# Patient Record
Sex: Male | Born: 1995 | Race: White | Hispanic: No | Marital: Single | Smoking: Never smoker
Health system: Southern US, Community
[De-identification: ages and names within clinical notes are randomized; demographics above are authoritative.]

---
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
## 2017-02-15 ENCOUNTER — Other Ambulatory Visit: Payer: Self-pay | Admitting: Ophthalmology

## 2017-02-15 DIAGNOSIS — H469 Unspecified optic neuritis: Secondary | ICD-10-CM

## 2017-12-18 DIAGNOSIS — M546 Pain in thoracic spine: Secondary | ICD-10-CM | POA: Diagnosis not present

## 2017-12-18 DIAGNOSIS — M25319 Other instability, unspecified shoulder: Secondary | ICD-10-CM | POA: Diagnosis not present

## 2017-12-18 DIAGNOSIS — M9902 Segmental and somatic dysfunction of thoracic region: Secondary | ICD-10-CM | POA: Diagnosis not present

## 2017-12-18 DIAGNOSIS — S29019A Strain of muscle and tendon of unspecified wall of thorax, initial encounter: Secondary | ICD-10-CM | POA: Diagnosis not present

## 2018-03-09 DIAGNOSIS — J209 Acute bronchitis, unspecified: Secondary | ICD-10-CM | POA: Diagnosis not present

## 2018-08-27 DIAGNOSIS — Z03818 Encounter for observation for suspected exposure to other biological agents ruled out: Secondary | ICD-10-CM | POA: Diagnosis not present

## 2018-08-27 DIAGNOSIS — U071 COVID-19: Secondary | ICD-10-CM | POA: Diagnosis not present

## 2018-09-03 DIAGNOSIS — Z03818 Encounter for observation for suspected exposure to other biological agents ruled out: Secondary | ICD-10-CM | POA: Diagnosis not present

## 2019-01-22 DIAGNOSIS — J3489 Other specified disorders of nose and nasal sinuses: Secondary | ICD-10-CM | POA: Diagnosis not present

## 2019-01-22 DIAGNOSIS — Z20828 Contact with and (suspected) exposure to other viral communicable diseases: Secondary | ICD-10-CM | POA: Diagnosis not present

## 2019-03-06 DIAGNOSIS — M25319 Other instability, unspecified shoulder: Secondary | ICD-10-CM | POA: Diagnosis not present

## 2019-03-06 DIAGNOSIS — S29019A Strain of muscle and tendon of unspecified wall of thorax, initial encounter: Secondary | ICD-10-CM | POA: Diagnosis not present

## 2019-03-06 DIAGNOSIS — M9902 Segmental and somatic dysfunction of thoracic region: Secondary | ICD-10-CM | POA: Diagnosis not present

## 2019-03-06 DIAGNOSIS — M546 Pain in thoracic spine: Secondary | ICD-10-CM | POA: Diagnosis not present

## 2019-03-13 DIAGNOSIS — M25319 Other instability, unspecified shoulder: Secondary | ICD-10-CM | POA: Diagnosis not present

## 2019-03-13 DIAGNOSIS — M9902 Segmental and somatic dysfunction of thoracic region: Secondary | ICD-10-CM | POA: Diagnosis not present

## 2019-03-13 DIAGNOSIS — S29019A Strain of muscle and tendon of unspecified wall of thorax, initial encounter: Secondary | ICD-10-CM | POA: Diagnosis not present

## 2019-03-13 DIAGNOSIS — M546 Pain in thoracic spine: Secondary | ICD-10-CM | POA: Diagnosis not present

## 2019-03-20 DIAGNOSIS — M25319 Other instability, unspecified shoulder: Secondary | ICD-10-CM | POA: Diagnosis not present

## 2019-03-20 DIAGNOSIS — S29019A Strain of muscle and tendon of unspecified wall of thorax, initial encounter: Secondary | ICD-10-CM | POA: Diagnosis not present

## 2019-03-20 DIAGNOSIS — M9902 Segmental and somatic dysfunction of thoracic region: Secondary | ICD-10-CM | POA: Diagnosis not present

## 2019-03-20 DIAGNOSIS — M546 Pain in thoracic spine: Secondary | ICD-10-CM | POA: Diagnosis not present

## 2019-03-27 DIAGNOSIS — M9902 Segmental and somatic dysfunction of thoracic region: Secondary | ICD-10-CM | POA: Diagnosis not present

## 2019-03-27 DIAGNOSIS — S29019A Strain of muscle and tendon of unspecified wall of thorax, initial encounter: Secondary | ICD-10-CM | POA: Diagnosis not present

## 2019-03-27 DIAGNOSIS — M25319 Other instability, unspecified shoulder: Secondary | ICD-10-CM | POA: Diagnosis not present

## 2019-03-27 DIAGNOSIS — M546 Pain in thoracic spine: Secondary | ICD-10-CM | POA: Diagnosis not present

## 2019-04-03 DIAGNOSIS — S29019A Strain of muscle and tendon of unspecified wall of thorax, initial encounter: Secondary | ICD-10-CM | POA: Diagnosis not present

## 2019-04-03 DIAGNOSIS — M25319 Other instability, unspecified shoulder: Secondary | ICD-10-CM | POA: Diagnosis not present

## 2019-04-03 DIAGNOSIS — M9902 Segmental and somatic dysfunction of thoracic region: Secondary | ICD-10-CM | POA: Diagnosis not present

## 2019-04-03 DIAGNOSIS — M546 Pain in thoracic spine: Secondary | ICD-10-CM | POA: Diagnosis not present

## 2019-05-17 DIAGNOSIS — Z13220 Encounter for screening for lipoid disorders: Secondary | ICD-10-CM | POA: Diagnosis not present

## 2019-05-17 DIAGNOSIS — Z0000 Encounter for general adult medical examination without abnormal findings: Secondary | ICD-10-CM | POA: Diagnosis not present

## 2019-05-17 DIAGNOSIS — Z131 Encounter for screening for diabetes mellitus: Secondary | ICD-10-CM | POA: Diagnosis not present

## 2019-05-17 DIAGNOSIS — Z1331 Encounter for screening for depression: Secondary | ICD-10-CM | POA: Diagnosis not present

## 2019-05-24 DIAGNOSIS — Z13220 Encounter for screening for lipoid disorders: Secondary | ICD-10-CM | POA: Diagnosis not present

## 2019-05-24 DIAGNOSIS — Z131 Encounter for screening for diabetes mellitus: Secondary | ICD-10-CM | POA: Diagnosis not present

## 2019-05-24 DIAGNOSIS — Z0000 Encounter for general adult medical examination without abnormal findings: Secondary | ICD-10-CM | POA: Diagnosis not present

## 2019-10-01 DIAGNOSIS — M791 Myalgia, unspecified site: Secondary | ICD-10-CM | POA: Diagnosis not present

## 2019-10-01 DIAGNOSIS — Z20828 Contact with and (suspected) exposure to other viral communicable diseases: Secondary | ICD-10-CM | POA: Diagnosis not present

## 2019-10-01 DIAGNOSIS — R05 Cough: Secondary | ICD-10-CM | POA: Diagnosis not present

## 2019-10-01 DIAGNOSIS — R5383 Other fatigue: Secondary | ICD-10-CM | POA: Diagnosis not present

## 2019-10-06 DIAGNOSIS — R070 Pain in throat: Secondary | ICD-10-CM | POA: Diagnosis not present

## 2019-10-06 DIAGNOSIS — Z20828 Contact with and (suspected) exposure to other viral communicable diseases: Secondary | ICD-10-CM | POA: Diagnosis not present

## 2019-10-06 DIAGNOSIS — R05 Cough: Secondary | ICD-10-CM | POA: Diagnosis not present

## 2020-01-03 DIAGNOSIS — M25511 Pain in right shoulder: Secondary | ICD-10-CM | POA: Diagnosis not present

## 2020-09-22 DIAGNOSIS — S50862A Insect bite (nonvenomous) of left forearm, initial encounter: Secondary | ICD-10-CM | POA: Diagnosis not present

## 2020-09-22 DIAGNOSIS — L03114 Cellulitis of left upper limb: Secondary | ICD-10-CM | POA: Diagnosis not present

## 2021-08-03 DIAGNOSIS — F4323 Adjustment disorder with mixed anxiety and depressed mood: Secondary | ICD-10-CM | POA: Diagnosis not present

## 2021-09-06 DIAGNOSIS — S2232XA Fracture of one rib, left side, initial encounter for closed fracture: Secondary | ICD-10-CM | POA: Diagnosis not present

## 2021-09-06 DIAGNOSIS — S7012XA Contusion of left thigh, initial encounter: Secondary | ICD-10-CM | POA: Diagnosis not present

## 2021-09-06 DIAGNOSIS — S20212A Contusion of left front wall of thorax, initial encounter: Secondary | ICD-10-CM | POA: Diagnosis not present

## 2021-09-06 DIAGNOSIS — M25512 Pain in left shoulder: Secondary | ICD-10-CM | POA: Diagnosis not present

## 2021-11-02 DIAGNOSIS — F4323 Adjustment disorder with mixed anxiety and depressed mood: Secondary | ICD-10-CM | POA: Diagnosis not present

## 2021-12-08 DIAGNOSIS — R112 Nausea with vomiting, unspecified: Secondary | ICD-10-CM | POA: Diagnosis not present

## 2021-12-08 DIAGNOSIS — K591 Functional diarrhea: Secondary | ICD-10-CM | POA: Diagnosis not present

## 2021-12-08 DIAGNOSIS — R509 Fever, unspecified: Secondary | ICD-10-CM | POA: Diagnosis not present
